# Patient Record
Sex: Male | Born: 1995 | Race: Black or African American | Hispanic: No | Marital: Single | Smoking: Current every day smoker
Health system: Southern US, Community
[De-identification: ages and names within clinical notes are randomized; demographics above are authoritative.]

## PROBLEM LIST (undated history)

## (undated) DIAGNOSIS — J45909 Unspecified asthma, uncomplicated: Secondary | ICD-10-CM

## (undated) HISTORY — PX: SKIN GRAFT: SHX250

---
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
## 2014-04-29 ENCOUNTER — Emergency Department (HOSPITAL_COMMUNITY): Payer: 59

## 2014-04-29 ENCOUNTER — Encounter (HOSPITAL_COMMUNITY): Payer: Self-pay | Admitting: Emergency Medicine

## 2014-04-29 ENCOUNTER — Emergency Department (HOSPITAL_COMMUNITY)
Admission: EM | Admit: 2014-04-29 | Discharge: 2014-04-29 | Disposition: A | Discharge status: Home / Self Care | Payer: 59 | Attending: Emergency Medicine | Admitting: Emergency Medicine

## 2014-04-29 DIAGNOSIS — Z79899 Other long term (current) drug therapy: Secondary | ICD-10-CM | POA: Insufficient documentation

## 2014-04-29 DIAGNOSIS — S59909A Unspecified injury of unspecified elbow, initial encounter: Secondary | ICD-10-CM | POA: Insufficient documentation

## 2014-04-29 DIAGNOSIS — F17200 Nicotine dependence, unspecified, uncomplicated: Secondary | ICD-10-CM | POA: Insufficient documentation

## 2014-04-29 DIAGNOSIS — S6980XA Other specified injuries of unspecified wrist, hand and finger(s), initial encounter: Secondary | ICD-10-CM | POA: Insufficient documentation

## 2014-04-29 DIAGNOSIS — Z880 Allergy status to penicillin: Secondary | ICD-10-CM | POA: Insufficient documentation

## 2014-04-29 DIAGNOSIS — J45909 Unspecified asthma, uncomplicated: Secondary | ICD-10-CM | POA: Insufficient documentation

## 2014-04-29 DIAGNOSIS — S6990XA Unspecified injury of unspecified wrist, hand and finger(s), initial encounter: Secondary | ICD-10-CM | POA: Insufficient documentation

## 2014-04-29 DIAGNOSIS — S59919A Unspecified injury of unspecified forearm, initial encounter: Secondary | ICD-10-CM

## 2014-04-29 MED ORDER — TRAMADOL HCL 50 MG PO TABS: 50.0000 mg | ORAL_TABLET | Freq: Four times a day (QID) | ORAL | Status: AC | PRN

## 2014-04-29 MED ORDER — OXYCODONE-ACETAMINOPHEN 5-325 MG PO TABS
1.0000 | ORAL_TABLET | Freq: Once | ORAL | Status: AC
  Administered 2014-04-29: 1 via ORAL
  Filled 2014-04-29: qty 1

## 2014-04-29 MED ORDER — NAPROXEN 500 MG PO TABS: 500.0000 mg | ORAL_TABLET | Freq: Two times a day (BID) | ORAL | Status: DC
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
## 2014-04-29 NOTE — Progress Notes (Signed)
Orthopedic Tech Progress Note Patient Details:  Guy Mendez 1996/03/05 [id number]  Ortho Devices Type of Ortho Device: Velcro wrist splint Ortho Device/Splint Location: rue Ortho Device/Splint Interventions: Application   [name], [name] 04/29/2014, 8:56 PM
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
## 2014-04-30 NOTE — ED Provider Notes (Signed)
Medical screening examination/treatment/procedure(s) were performed by non-physician practitioner and as supervising physician I was immediately available for consultation/collaboration.   EKG Interpretation None        William Ryder Man, MD 04/30/14 0032 
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
## 2016-03-11 ENCOUNTER — Emergency Department (HOSPITAL_COMMUNITY)
Admission: EM | Admit: 2016-03-11 | Discharge: 2016-03-11 | Disposition: A | Discharge status: Home / Self Care | Payer: No Typology Code available for payment source | Attending: Emergency Medicine | Admitting: Emergency Medicine

## 2016-03-11 ENCOUNTER — Emergency Department (HOSPITAL_COMMUNITY): Payer: No Typology Code available for payment source

## 2016-03-11 ENCOUNTER — Encounter (HOSPITAL_COMMUNITY): Payer: Self-pay | Admitting: Emergency Medicine

## 2016-03-11 DIAGNOSIS — S4992XA Unspecified injury of left shoulder and upper arm, initial encounter: Secondary | ICD-10-CM | POA: Diagnosis present

## 2016-03-11 DIAGNOSIS — S3992XA Unspecified injury of lower back, initial encounter: Secondary | ICD-10-CM | POA: Insufficient documentation

## 2016-03-11 DIAGNOSIS — J45909 Unspecified asthma, uncomplicated: Secondary | ICD-10-CM | POA: Insufficient documentation

## 2016-03-11 DIAGNOSIS — Y998 Other external cause status: Secondary | ICD-10-CM | POA: Insufficient documentation

## 2016-03-11 DIAGNOSIS — S79911A Unspecified injury of right hip, initial encounter: Secondary | ICD-10-CM | POA: Insufficient documentation

## 2016-03-11 DIAGNOSIS — Y9389 Activity, other specified: Secondary | ICD-10-CM | POA: Insufficient documentation

## 2016-03-11 DIAGNOSIS — Y92410 Unspecified street and highway as the place of occurrence of the external cause: Secondary | ICD-10-CM | POA: Insufficient documentation

## 2016-03-11 DIAGNOSIS — F17200 Nicotine dependence, unspecified, uncomplicated: Secondary | ICD-10-CM | POA: Insufficient documentation

## 2016-03-11 NOTE — ED Notes (Signed)
Pt called again from triage with no answer 

## 2016-03-11 NOTE — ED Notes (Signed)
Writer called twice for room assignment, no response 
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
## 2016-06-18 ENCOUNTER — Encounter (HOSPITAL_COMMUNITY): Payer: Self-pay | Admitting: Emergency Medicine

## 2016-06-18 ENCOUNTER — Emergency Department (HOSPITAL_COMMUNITY)
Admission: EM | Admit: 2016-06-18 | Discharge: 2016-06-19 | Disposition: A | Discharge status: Home / Self Care | Payer: Self-pay | Attending: Emergency Medicine | Admitting: Emergency Medicine

## 2016-06-18 ENCOUNTER — Emergency Department (HOSPITAL_COMMUNITY): Payer: Self-pay

## 2016-06-18 DIAGNOSIS — W3400XA Accidental discharge from unspecified firearms or gun, initial encounter: Secondary | ICD-10-CM

## 2016-06-18 DIAGNOSIS — M79604 Pain in right leg: Secondary | ICD-10-CM

## 2016-06-18 DIAGNOSIS — W3400XD Accidental discharge from unspecified firearms or gun, subsequent encounter: Secondary | ICD-10-CM | POA: Insufficient documentation

## 2016-06-18 DIAGNOSIS — F17200 Nicotine dependence, unspecified, uncomplicated: Secondary | ICD-10-CM | POA: Insufficient documentation

## 2016-06-18 DIAGNOSIS — J45909 Unspecified asthma, uncomplicated: Secondary | ICD-10-CM | POA: Insufficient documentation

## 2016-06-18 DIAGNOSIS — S81801D Unspecified open wound, right lower leg, subsequent encounter: Secondary | ICD-10-CM | POA: Insufficient documentation

## 2016-06-18 MED ORDER — BACITRACIN ZINC 500 UNIT/GM EX OINT
TOPICAL_OINTMENT | Freq: Two times a day (BID) | CUTANEOUS | Status: DC
  Administered 2016-06-18: 1 via TOPICAL
  Filled 2016-06-18: qty 0.9

## 2016-06-18 MED ORDER — HYDROCODONE-ACETAMINOPHEN 5-325 MG PO TABS
1.0000 | ORAL_TABLET | Freq: Once | ORAL | Status: AC
Start: 2016-06-18 — End: 2016-06-18
  Administered 2016-06-18: 1 via ORAL
  Filled 2016-06-18: qty 1

## 2016-06-18 MED ORDER — BACITRACIN ZINC 500 UNIT/GM EX OINT: 1.0000 "application " | TOPICAL_OINTMENT | Freq: Two times a day (BID) | CUTANEOUS | 0 refills | Status: AC

## 2016-06-18 MED ORDER — NAPROXEN 250 MG PO TABS
500.0000 mg | ORAL_TABLET | Freq: Once | ORAL | Status: AC
  Administered 2016-06-18: 500 mg via ORAL
  Filled 2016-06-18: qty 2

## 2016-06-18 MED ORDER — NAPROXEN 500 MG PO TABS: 500.0000 mg | ORAL_TABLET | Freq: Two times a day (BID) | ORAL | 0 refills | Status: DC
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
## 2016-06-18 NOTE — Discharge Instructions (Signed)
The pain is likely from the tissue injury and resultant healing and swelling. Use RICE therapy as the reading instructs. Keep the area clean and dry. See [facility] f/u ideally, otherwise you may see the surgeons here.
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
## 2016-06-19 NOTE — ED Notes (Signed)
Pt verbalized understanding of d/c instructions and has no further questions. Pt stable and NAD. Pt d/c home with brother driving.

## 2017-11-14 ENCOUNTER — Emergency Department (HOSPITAL_COMMUNITY): Payer: Self-pay

## 2017-11-14 ENCOUNTER — Emergency Department (HOSPITAL_COMMUNITY)
Admission: EM | Admit: 2017-11-14 | Discharge: 2017-11-14 | Disposition: A | Discharge status: Home / Self Care | Payer: Self-pay | Attending: Emergency Medicine | Admitting: Emergency Medicine

## 2017-11-14 ENCOUNTER — Other Ambulatory Visit: Payer: Self-pay

## 2017-11-14 ENCOUNTER — Encounter (HOSPITAL_COMMUNITY): Payer: Self-pay | Admitting: Emergency Medicine

## 2017-11-14 DIAGNOSIS — J45909 Unspecified asthma, uncomplicated: Secondary | ICD-10-CM | POA: Insufficient documentation

## 2017-11-14 DIAGNOSIS — Z79899 Other long term (current) drug therapy: Secondary | ICD-10-CM | POA: Insufficient documentation

## 2017-11-14 DIAGNOSIS — N419 Inflammatory disease of prostate, unspecified: Secondary | ICD-10-CM

## 2017-11-14 DIAGNOSIS — F17200 Nicotine dependence, unspecified, uncomplicated: Secondary | ICD-10-CM | POA: Insufficient documentation

## 2017-11-14 DIAGNOSIS — M545 Low back pain, unspecified: Secondary | ICD-10-CM

## 2017-11-14 DIAGNOSIS — R10814 Left lower quadrant abdominal tenderness: Secondary | ICD-10-CM | POA: Insufficient documentation

## 2017-11-14 LAB — URINALYSIS, ROUTINE W REFLEX MICROSCOPIC
Bilirubin Urine: NEGATIVE
GLUCOSE, UA: NEGATIVE mg/dL
Hgb urine dipstick: NEGATIVE
KETONES UR: NEGATIVE mg/dL
Leukocytes, UA: NEGATIVE
Nitrite: NEGATIVE
Protein, ur: NEGATIVE mg/dL
Specific Gravity, Urine: 1.016 (ref 1.005–1.030)
pH: 6 (ref 5.0–8.0)

## 2017-11-14 LAB — CBC WITH DIFFERENTIAL/PLATELET
Basophils Absolute: 0 10*3/uL (ref 0.0–0.1)
Basophils Relative: 0 %
Eosinophils Absolute: 0.2 10*3/uL (ref 0.0–0.7)
Eosinophils Relative: 2 %
HEMATOCRIT: 44.8 % (ref 39.0–52.0)
HEMOGLOBIN: 16 g/dL (ref 13.0–17.0)
LYMPHS ABS: 2.3 10*3/uL (ref 0.7–4.0)
LYMPHS PCT: 28 %
MCH: 31.1 pg (ref 26.0–34.0)
MCHC: 35.7 g/dL (ref 30.0–36.0)
MCV: 87 fL (ref 78.0–100.0)
MONO ABS: 0.5 10*3/uL (ref 0.1–1.0)
Monocytes Relative: 6 %
NEUTROS ABS: 5.3 10*3/uL (ref 1.7–7.7)
Neutrophils Relative %: 64 %
Platelets: 123 10*3/uL — ABNORMAL LOW (ref 150–400)
RBC: 5.15 MIL/uL (ref 4.22–5.81)
RDW: 12.8 % (ref 11.5–15.5)
WBC: 8.3 10*3/uL (ref 4.0–10.5)

## 2017-11-14 LAB — COMPREHENSIVE METABOLIC PANEL
ALK PHOS: 67 U/L (ref 38–126)
ALT: 14 U/L — ABNORMAL LOW (ref 17–63)
AST: 19 U/L (ref 15–41)
Albumin: 4.1 g/dL (ref 3.5–5.0)
Anion gap: 6 (ref 5–15)
BUN: 13 mg/dL (ref 6–20)
CO2: 26 mmol/L (ref 22–32)
Calcium: 9 mg/dL (ref 8.9–10.3)
Chloride: 108 mmol/L (ref 101–111)
Creatinine, Ser: 0.91 mg/dL (ref 0.61–1.24)
GFR calc Af Amer: >60 mL/min (ref >60)
GLUCOSE: 93 mg/dL (ref 65–99)
POTASSIUM: 4.2 mmol/L (ref 3.5–5.1)
Sodium: 140 mmol/L (ref 135–145)
TOTAL PROTEIN: 6.3 g/dL — AB (ref 6.5–8.1)
Total Bilirubin: 0.6 mg/dL (ref 0.3–1.2)

## 2017-11-14 MED ORDER — AZITHROMYCIN 250 MG PO TABS
1000.0000 mg | ORAL_TABLET | Freq: Once | ORAL | Status: AC
  Administered 2017-11-14: 1000 mg via ORAL
  Filled 2017-11-14: qty 4

## 2017-11-14 MED ORDER — METHOCARBAMOL 1000 MG/10ML IJ SOLN
1000.0000 mg | Freq: Once | INTRAMUSCULAR | Status: DC
  Filled 2017-11-14: qty 10

## 2017-11-14 MED ORDER — SODIUM CHLORIDE 0.9 % IV BOLUS (SEPSIS)
1000.0000 mL | Freq: Once | INTRAVENOUS | Status: AC
  Administered 2017-11-14: 1000 mL via INTRAVENOUS

## 2017-11-14 MED ORDER — IOPAMIDOL (ISOVUE-300) INJECTION 61%
INTRAVENOUS | Status: AC
  Administered 2017-11-14: 100 mL
  Filled 2017-11-14: qty 100

## 2017-11-14 MED ORDER — METHOCARBAMOL 1000 MG/10ML IJ SOLN
1000.0000 mg | Freq: Once | INTRAVENOUS | Status: AC
  Administered 2017-11-14: 1000 mg via INTRAVENOUS
  Filled 2017-11-14: qty 10

## 2017-11-14 MED ORDER — KETOROLAC TROMETHAMINE 30 MG/ML IJ SOLN
30.0000 mg | Freq: Once | INTRAMUSCULAR | Status: AC
  Administered 2017-11-14: 30 mg via INTRAVENOUS
  Filled 2017-11-14: qty 1

## 2017-11-14 MED ORDER — CIPROFLOXACIN HCL 500 MG PO TABS: 500.0000 mg | ORAL_TABLET | Freq: Two times a day (BID) | ORAL | 0 refills | Status: AC

## 2017-11-14 MED ORDER — CEFTRIAXONE SODIUM 250 MG IJ SOLR: 250.0000 mg | Freq: Once | INTRAMUSCULAR | Status: DC

## 2017-11-14 NOTE — ED Notes (Signed)
Pt taken to CT.

## 2017-11-14 NOTE — ED Triage Notes (Signed)
Pt c/o 10/10 lower back pain for about 3 days ago getting worse tonight denies any injury.
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
## 2017-11-17 LAB — GC/CHLAMYDIA PROBE AMP (~~LOC~~) NOT AT ARMC
CHLAMYDIA, DNA PROBE: NEGATIVE
NEISSERIA GONORRHEA: NEGATIVE

## 2018-05-02 ENCOUNTER — Encounter (HOSPITAL_COMMUNITY): Payer: Self-pay | Admitting: Emergency Medicine

## 2018-05-02 ENCOUNTER — Other Ambulatory Visit: Payer: Self-pay

## 2018-05-02 ENCOUNTER — Emergency Department (HOSPITAL_COMMUNITY)
Admission: EM | Admit: 2018-05-02 | Discharge: 2018-05-02 | Disposition: A | Discharge status: Home / Self Care | Payer: Self-pay | Attending: Emergency Medicine | Admitting: Emergency Medicine

## 2018-05-02 DIAGNOSIS — Z5321 Procedure and treatment not carried out due to patient leaving prior to being seen by health care provider: Secondary | ICD-10-CM | POA: Insufficient documentation

## 2018-05-02 DIAGNOSIS — R109 Unspecified abdominal pain: Secondary | ICD-10-CM | POA: Insufficient documentation

## 2018-05-02 LAB — COMPREHENSIVE METABOLIC PANEL
ALK PHOS: 63 U/L (ref 38–126)
ALT: 22 U/L (ref 17–63)
AST: 28 U/L (ref 15–41)
Albumin: 4.6 g/dL (ref 3.5–5.0)
Anion gap: 8 (ref 5–15)
BUN: 11 mg/dL (ref 6–20)
CALCIUM: 9.5 mg/dL (ref 8.9–10.3)
CHLORIDE: 103 mmol/L (ref 101–111)
CO2: 26 mmol/L (ref 22–32)
CREATININE: 0.9 mg/dL (ref 0.61–1.24)
GFR calc Af Amer: >60 mL/min (ref >60)
GFR calc non Af Amer: >60 mL/min (ref >60)
Glucose, Bld: 108 mg/dL — ABNORMAL HIGH (ref 65–99)
Potassium: 4.1 mmol/L (ref 3.5–5.1)
Sodium: 137 mmol/L (ref 135–145)
Total Bilirubin: 1 mg/dL (ref 0.3–1.2)
Total Protein: 6.8 g/dL (ref 6.5–8.1)

## 2018-05-02 LAB — CBC
HCT: 44.3 % (ref 39.0–52.0)
Hemoglobin: 15.3 g/dL (ref 13.0–17.0)
MCH: 30.2 pg (ref 26.0–34.0)
MCHC: 34.5 g/dL (ref 30.0–36.0)
MCV: 87.4 fL (ref 78.0–100.0)
PLATELETS: 155 10*3/uL (ref 150–400)
RBC: 5.07 MIL/uL (ref 4.22–5.81)
RDW: 11.9 % (ref 11.5–15.5)
WBC: 7.4 10*3/uL (ref 4.0–10.5)

## 2018-05-02 LAB — URINALYSIS, ROUTINE W REFLEX MICROSCOPIC
Bilirubin Urine: NEGATIVE
Glucose, UA: NEGATIVE mg/dL
HGB URINE DIPSTICK: NEGATIVE
KETONES UR: NEGATIVE mg/dL
Leukocytes, UA: NEGATIVE
Nitrite: NEGATIVE
PROTEIN: NEGATIVE mg/dL
Specific Gravity, Urine: 1.01 (ref 1.005–1.030)
pH: 8 (ref 5.0–8.0)

## 2018-05-02 LAB — LIPASE, BLOOD: LIPASE: 29 U/L (ref 11–51)

## 2018-05-02 NOTE — ED Triage Notes (Signed)
C/o R sided abd pain x 2 days.  Reports nausea Thursday night that has resolved.  Also reports burning with urination and sexual partner positive for STD.

## 2018-05-02 NOTE — ED Notes (Signed)
Patient left after triage, encouraged patient to stay but patient declined

## 2018-05-04 NOTE — ED Notes (Signed)
Follow up call made  No answer  05/04/18  1230  s Keno Caraway rn

## 2019-10-22 ENCOUNTER — Other Ambulatory Visit: Payer: Self-pay

## 2019-10-22 ENCOUNTER — Emergency Department (HOSPITAL_COMMUNITY)
Admission: EM | Admit: 2019-10-22 | Discharge: 2019-10-22 | Disposition: A | Discharge status: Home / Self Care | Payer: Self-pay | Attending: Emergency Medicine | Admitting: Emergency Medicine

## 2019-10-22 ENCOUNTER — Emergency Department (HOSPITAL_COMMUNITY): Payer: Self-pay

## 2019-10-22 DIAGNOSIS — Y998 Other external cause status: Secondary | ICD-10-CM | POA: Insufficient documentation

## 2019-10-22 DIAGNOSIS — J45909 Unspecified asthma, uncomplicated: Secondary | ICD-10-CM | POA: Insufficient documentation

## 2019-10-22 DIAGNOSIS — Y9389 Activity, other specified: Secondary | ICD-10-CM | POA: Insufficient documentation

## 2019-10-22 DIAGNOSIS — S46911A Strain of unspecified muscle, fascia and tendon at shoulder and upper arm level, right arm, initial encounter: Secondary | ICD-10-CM | POA: Insufficient documentation

## 2019-10-22 DIAGNOSIS — Y929 Unspecified place or not applicable: Secondary | ICD-10-CM | POA: Insufficient documentation

## 2019-10-22 DIAGNOSIS — F17200 Nicotine dependence, unspecified, uncomplicated: Secondary | ICD-10-CM | POA: Insufficient documentation

## 2019-10-22 DIAGNOSIS — X509XXA Other and unspecified overexertion or strenuous movements or postures, initial encounter: Secondary | ICD-10-CM | POA: Insufficient documentation
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
## 2019-10-22 NOTE — ED Triage Notes (Signed)
Patient to ED complaining of Right shoulder pain and right rib cage pain 7/10. Patient has hx of shoulder pain and when patient was arrested shoulder was aggravated again. Pt bp 130/80, pulse 110, RR 18, SpO2 97% RA, 99.5 Oral temp.

## 2019-10-22 NOTE — Discharge Instructions (Signed)
We recommend 600 mg ibuprofen every 6 hours for management of pain.  Alternate ice and heat to your shoulder 3-4 times per day to help with inflammation and spasm.  Continue with frequent shoulder stretching exercises.  We advise follow-up with orthopedics when you are able.  You may return for any new or concerning symptoms.

## 2020-12-23 ENCOUNTER — Other Ambulatory Visit: Payer: Self-pay

## 2020-12-23 ENCOUNTER — Ambulatory Visit (HOSPITAL_COMMUNITY)
Admission: EM | Admit: 2020-12-23 | Discharge: 2020-12-23 | Disposition: A | Discharge status: Home / Self Care | Payer: Self-pay | Attending: Urgent Care | Admitting: Urgent Care

## 2020-12-23 ENCOUNTER — Ambulatory Visit (INDEPENDENT_AMBULATORY_CARE_PROVIDER_SITE_OTHER): Payer: Self-pay

## 2020-12-23 ENCOUNTER — Encounter (HOSPITAL_COMMUNITY): Payer: Self-pay

## 2020-12-23 DIAGNOSIS — M79672 Pain in left foot: Secondary | ICD-10-CM

## 2020-12-23 DIAGNOSIS — S9032XA Contusion of left foot, initial encounter: Secondary | ICD-10-CM

## 2020-12-23 DIAGNOSIS — S99922A Unspecified injury of left foot, initial encounter: Secondary | ICD-10-CM

## 2020-12-23 MED ORDER — NAPROXEN 500 MG PO TABS: 500.0000 mg | ORAL_TABLET | Freq: Two times a day (BID) | ORAL | 0 refills | Status: AC

## 2020-12-23 NOTE — ED Triage Notes (Signed)
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 Since then the foot has been throbbing with swelling and difficulty walking.

## 2020-12-23 NOTE — ED Provider Notes (Signed)
[facility]   MRN: [id number] DOB: 06-Apr-1996  Subjective:   Guy Mendez is a 25 y.o. male presenting for suffering a left foot injury 2 days ago.  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  Accidentally dropped a dolly on his left foot.  States that he has had swelling, moderate to severe pain.  He is able to walk but has significant pain over the plantar surface of his foot.  Has not taken any oral medications for relief.  Denies bony deformity, bruising.  No current facility-administered medications for this encounter.  Current Outpatient Medications:  .  bacitracin ointment, Apply 1 application topically 2 (two) times daily. (Patient not taking: Reported on 11/14/2017), Disp: 120 g, Rfl: 0 .  ciprofloxacin (CIPRO) 500 MG tablet, Take 1 tablet (500 mg total) by mouth every 12 (twelve) hours., Disp: 28 tablet, Rfl: 0 .  EPINEPHrine (EPI-PEN) 0.3 mg/0.3 mL DEVI, Inject 0.3 mg into the muscle daily as needed (Anaphylaxis). , Disp: , Rfl:  .  Menthol-Methyl Salicylate (MUSCLE RUB) 10-15 % CREA, Apply 1 application topically as needed for muscle pain., Disp: , Rfl:  .  traMADol (ULTRAM) 50 MG tablet, Take 1 tablet (50 mg total) by mouth every 6 (six) hours as needed. (Patient not taking: Reported on 06/18/2016), Disp: 15 tablet, Rfl: 0   Allergies  Allergen Reactions  . Bee Venom Anaphylaxis    Pt has epi pen  . Amoxicillin Hives, Itching and Rash    Past Medical History:  Diagnosis Date  . Asthma      Past Surgical History:  Procedure Laterality Date  . SKIN GRAFT      History reviewed. No pertinent family history.  Social History   Tobacco Use  . Smoking status: Current Every Day Smoker  . Smokeless tobacco: Never Used  Substance Use Topics  . Alcohol use: Yes  . Drug use: No    ROS   Objective:   Vitals: BP 124/75 (BP Location: Right Arm)   Pulse 78   Temp 98.6 F (37 C) (Oral)   Resp 16   SpO2 100%   Physical Exam Constitutional:       General: He is not in acute distress.    Appearance: Normal appearance. He is well-developed and normal weight. He is not ill-appearing, toxic-appearing or diaphoretic.  HENT:     Head: Normocephalic and atraumatic.     Right Ear: External ear normal.     Left Ear: External ear normal.     Nose: Nose normal.     Mouth/Throat:     Pharynx: Oropharynx is clear.  Eyes:     General: No scleral icterus.       Right eye: No discharge.        Left eye: No discharge.     Extraocular Movements: Extraocular movements intact.     Pupils: Pupils are equal, round, and reactive to light.  Cardiovascular:     Rate and Rhythm: Normal rate.  Pulmonary:     Effort: Pulmonary effort is normal.  Musculoskeletal:     Cervical back: Normal range of motion.       Feet:  Neurological:     Mental Status: He is alert and oriented to person, place, and time.  Psychiatric:        Mood and Affect: Mood normal.        Behavior: Behavior normal.        Thought Content: Thought content normal.  Judgment: Judgment normal.     DG Foot Complete Left  Result Date: 12/23/2020 CLINICAL DATA:  Left foot pain after injury. EXAM: LEFT FOOT - COMPLETE 3+ VIEW COMPARISON:  None. FINDINGS: There is no evidence of fracture or dislocation. There is no evidence of arthropathy or other focal bone abnormality. Soft tissues are unremarkable. IMPRESSION: Negative. Electronically Signed   By: Lupita Raider M.D.   On: 12/23/2020 13:41     Assessment and Plan :   PDMP not reviewed this encounter.  1. Left foot pain   2. Contusion of left foot, initial encounter     Will manage conservatively for foot contusion with naproxen, RICE method. Counseled patient on potential for adverse effects with medications prescribed/recommended today, ER and return-to-clinic precautions discussed, patient verbalized understanding.    Wallis Bamberg, PA-C 12/23/20 1347

## 2022-02-12 ENCOUNTER — Ambulatory Visit (HOSPITAL_COMMUNITY): Payer: Self-pay

## 2022-02-22 ENCOUNTER — Ambulatory Visit (HOSPITAL_COMMUNITY): Payer: Self-pay

## 2022-02-26 ENCOUNTER — Ambulatory Visit: Payer: Self-pay

## 2022-02-27 ENCOUNTER — Ambulatory Visit (HOSPITAL_COMMUNITY): Payer: Self-pay

## 2024-10-28 ENCOUNTER — Other Ambulatory Visit: Payer: Self-pay | Admitting: Certified Nurse Midwife

## 2024-10-28 DIAGNOSIS — N341 Nonspecific urethritis: Secondary | ICD-10-CM

## 2024-10-28 MED ORDER — DOXYCYCLINE HYCLATE 100 MG PO TABS: 100.0000 mg | ORAL_TABLET | Freq: Two times a day (BID) | ORAL | 0 refills | Status: AC

## 2024-10-28 NOTE — Progress Notes (Signed)
 Sent for expedited partner treatment.  Guy Mendez, CNM, MSN, IBCLC Certified Nurse Midwife, [facility]
